# Patient Record
Sex: Female | Born: 2000 | Race: Asian | Hispanic: No | Marital: Single | Smoking: Never smoker
Health system: Southern US, Community
[De-identification: ages and names within clinical notes are randomized; demographics above are authoritative.]

## PROBLEM LIST (undated history)

## (undated) DIAGNOSIS — F329 Major depressive disorder, single episode, unspecified: Secondary | ICD-10-CM

## (undated) DIAGNOSIS — F419 Anxiety disorder, unspecified: Secondary | ICD-10-CM

## (undated) DIAGNOSIS — T7840XA Allergy, unspecified, initial encounter: Secondary | ICD-10-CM

## (undated) HISTORY — PX: HERNIA REPAIR: SHX51

## (undated) HISTORY — PX: TONSILLECTOMY: SUR1361

## (undated) HISTORY — DX: Major depressive disorder, single episode, unspecified: F32.9

---
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
## 2022-01-08 DIAGNOSIS — F330 Major depressive disorder, recurrent, mild: Secondary | ICD-10-CM | POA: Diagnosis not present

## 2022-01-21 DIAGNOSIS — F33 Major depressive disorder, recurrent: Secondary | ICD-10-CM | POA: Diagnosis not present

## 2022-01-22 DIAGNOSIS — F330 Major depressive disorder, recurrent, mild: Secondary | ICD-10-CM | POA: Diagnosis not present

## 2022-01-27 DIAGNOSIS — F33 Major depressive disorder, recurrent: Secondary | ICD-10-CM | POA: Diagnosis not present

## 2022-01-29 DIAGNOSIS — F330 Major depressive disorder, recurrent, mild: Secondary | ICD-10-CM | POA: Diagnosis not present

## 2022-02-05 DIAGNOSIS — F330 Major depressive disorder, recurrent, mild: Secondary | ICD-10-CM | POA: Diagnosis not present

## 2022-02-11 ENCOUNTER — Encounter: Payer: Self-pay | Admitting: Gastroenterology

## 2022-02-12 DIAGNOSIS — F330 Major depressive disorder, recurrent, mild: Secondary | ICD-10-CM | POA: Diagnosis not present

## 2022-02-19 DIAGNOSIS — F330 Major depressive disorder, recurrent, mild: Secondary | ICD-10-CM | POA: Diagnosis not present

## 2022-02-26 DIAGNOSIS — F330 Major depressive disorder, recurrent, mild: Secondary | ICD-10-CM | POA: Diagnosis not present

## 2022-03-05 DIAGNOSIS — F330 Major depressive disorder, recurrent, mild: Secondary | ICD-10-CM | POA: Diagnosis not present

## 2022-03-12 DIAGNOSIS — F330 Major depressive disorder, recurrent, mild: Secondary | ICD-10-CM | POA: Diagnosis not present

## 2022-03-19 DIAGNOSIS — F330 Major depressive disorder, recurrent, mild: Secondary | ICD-10-CM | POA: Diagnosis not present

## 2022-03-26 DIAGNOSIS — F330 Major depressive disorder, recurrent, mild: Secondary | ICD-10-CM | POA: Diagnosis not present

## 2022-03-29 DIAGNOSIS — R509 Fever, unspecified: Secondary | ICD-10-CM | POA: Diagnosis not present

## 2022-03-29 DIAGNOSIS — J029 Acute pharyngitis, unspecified: Secondary | ICD-10-CM | POA: Diagnosis not present

## 2022-04-06 DIAGNOSIS — F330 Major depressive disorder, recurrent, mild: Secondary | ICD-10-CM | POA: Diagnosis not present

## 2022-04-07 DIAGNOSIS — J069 Acute upper respiratory infection, unspecified: Secondary | ICD-10-CM | POA: Diagnosis not present

## 2022-04-16 DIAGNOSIS — F330 Major depressive disorder, recurrent, mild: Secondary | ICD-10-CM | POA: Diagnosis not present

## 2022-04-28 DIAGNOSIS — F330 Major depressive disorder, recurrent, mild: Secondary | ICD-10-CM | POA: Diagnosis not present

## 2022-05-06 DIAGNOSIS — F330 Major depressive disorder, recurrent, mild: Secondary | ICD-10-CM | POA: Diagnosis not present

## 2022-05-21 DIAGNOSIS — F330 Major depressive disorder, recurrent, mild: Secondary | ICD-10-CM | POA: Diagnosis not present

## 2022-05-28 DIAGNOSIS — F330 Major depressive disorder, recurrent, mild: Secondary | ICD-10-CM | POA: Diagnosis not present

## 2022-06-04 DIAGNOSIS — F330 Major depressive disorder, recurrent, mild: Secondary | ICD-10-CM | POA: Diagnosis not present

## 2022-06-11 DIAGNOSIS — F330 Major depressive disorder, recurrent, mild: Secondary | ICD-10-CM | POA: Diagnosis not present

## 2022-06-18 DIAGNOSIS — F330 Major depressive disorder, recurrent, mild: Secondary | ICD-10-CM | POA: Diagnosis not present

## 2022-06-25 DIAGNOSIS — F330 Major depressive disorder, recurrent, mild: Secondary | ICD-10-CM | POA: Diagnosis not present

## 2022-07-02 DIAGNOSIS — F330 Major depressive disorder, recurrent, mild: Secondary | ICD-10-CM | POA: Diagnosis not present

## 2022-07-09 DIAGNOSIS — F330 Major depressive disorder, recurrent, mild: Secondary | ICD-10-CM | POA: Diagnosis not present

## 2022-07-15 DIAGNOSIS — F330 Major depressive disorder, recurrent, mild: Secondary | ICD-10-CM | POA: Diagnosis not present

## 2022-08-06 DIAGNOSIS — F330 Major depressive disorder, recurrent, mild: Secondary | ICD-10-CM | POA: Diagnosis not present

## 2022-08-13 DIAGNOSIS — F330 Major depressive disorder, recurrent, mild: Secondary | ICD-10-CM | POA: Diagnosis not present

## 2022-08-20 DIAGNOSIS — F330 Major depressive disorder, recurrent, mild: Secondary | ICD-10-CM | POA: Diagnosis not present

## 2022-08-27 DIAGNOSIS — F330 Major depressive disorder, recurrent, mild: Secondary | ICD-10-CM | POA: Diagnosis not present

## 2022-09-10 DIAGNOSIS — F330 Major depressive disorder, recurrent, mild: Secondary | ICD-10-CM | POA: Diagnosis not present

## 2022-09-17 DIAGNOSIS — F330 Major depressive disorder, recurrent, mild: Secondary | ICD-10-CM | POA: Diagnosis not present

## 2022-09-22 DIAGNOSIS — R29898 Other symptoms and signs involving the musculoskeletal system: Secondary | ICD-10-CM | POA: Diagnosis not present

## 2022-09-24 DIAGNOSIS — F330 Major depressive disorder, recurrent, mild: Secondary | ICD-10-CM | POA: Diagnosis not present

## 2022-10-01 DIAGNOSIS — F330 Major depressive disorder, recurrent, mild: Secondary | ICD-10-CM | POA: Diagnosis not present

## 2022-10-08 DIAGNOSIS — F330 Major depressive disorder, recurrent, mild: Secondary | ICD-10-CM | POA: Diagnosis not present

## 2022-10-15 DIAGNOSIS — F330 Major depressive disorder, recurrent, mild: Secondary | ICD-10-CM | POA: Diagnosis not present

## 2022-10-22 DIAGNOSIS — F330 Major depressive disorder, recurrent, mild: Secondary | ICD-10-CM | POA: Diagnosis not present

## 2022-10-29 DIAGNOSIS — F330 Major depressive disorder, recurrent, mild: Secondary | ICD-10-CM | POA: Diagnosis not present

## 2022-11-09 DIAGNOSIS — F330 Major depressive disorder, recurrent, mild: Secondary | ICD-10-CM | POA: Diagnosis not present

## 2022-11-16 DIAGNOSIS — F330 Major depressive disorder, recurrent, mild: Secondary | ICD-10-CM | POA: Diagnosis not present

## 2022-11-23 DIAGNOSIS — F330 Major depressive disorder, recurrent, mild: Secondary | ICD-10-CM | POA: Diagnosis not present

## 2022-11-30 DIAGNOSIS — F330 Major depressive disorder, recurrent, mild: Secondary | ICD-10-CM | POA: Diagnosis not present

## 2022-12-07 DIAGNOSIS — F330 Major depressive disorder, recurrent, mild: Secondary | ICD-10-CM | POA: Diagnosis not present

## 2022-12-14 DIAGNOSIS — Z0000 Encounter for general adult medical examination without abnormal findings: Secondary | ICD-10-CM | POA: Diagnosis not present

## 2022-12-14 DIAGNOSIS — F332 Major depressive disorder, recurrent severe without psychotic features: Secondary | ICD-10-CM | POA: Diagnosis not present

## 2022-12-14 DIAGNOSIS — D500 Iron deficiency anemia secondary to blood loss (chronic): Secondary | ICD-10-CM | POA: Diagnosis not present

## 2022-12-14 DIAGNOSIS — E559 Vitamin D deficiency, unspecified: Secondary | ICD-10-CM | POA: Diagnosis not present

## 2022-12-14 DIAGNOSIS — F330 Major depressive disorder, recurrent, mild: Secondary | ICD-10-CM | POA: Diagnosis not present

## 2022-12-31 DIAGNOSIS — F330 Major depressive disorder, recurrent, mild: Secondary | ICD-10-CM | POA: Diagnosis not present

## 2022-12-31 DIAGNOSIS — F331 Major depressive disorder, recurrent, moderate: Secondary | ICD-10-CM | POA: Diagnosis not present

## 2023-01-07 DIAGNOSIS — F330 Major depressive disorder, recurrent, mild: Secondary | ICD-10-CM | POA: Diagnosis not present

## 2023-01-07 DIAGNOSIS — F331 Major depressive disorder, recurrent, moderate: Secondary | ICD-10-CM | POA: Diagnosis not present

## 2023-01-14 DIAGNOSIS — F331 Major depressive disorder, recurrent, moderate: Secondary | ICD-10-CM | POA: Diagnosis not present

## 2023-01-21 DIAGNOSIS — F331 Major depressive disorder, recurrent, moderate: Secondary | ICD-10-CM | POA: Diagnosis not present

## 2023-02-04 DIAGNOSIS — F331 Major depressive disorder, recurrent, moderate: Secondary | ICD-10-CM | POA: Diagnosis not present

## 2023-02-11 DIAGNOSIS — F331 Major depressive disorder, recurrent, moderate: Secondary | ICD-10-CM | POA: Diagnosis not present

## 2023-02-18 DIAGNOSIS — F331 Major depressive disorder, recurrent, moderate: Secondary | ICD-10-CM | POA: Diagnosis not present

## 2023-02-25 DIAGNOSIS — F331 Major depressive disorder, recurrent, moderate: Secondary | ICD-10-CM | POA: Diagnosis not present

## 2023-02-28 DIAGNOSIS — D500 Iron deficiency anemia secondary to blood loss (chronic): Secondary | ICD-10-CM | POA: Diagnosis not present

## 2023-02-28 DIAGNOSIS — Z79899 Other long term (current) drug therapy: Secondary | ICD-10-CM | POA: Diagnosis not present

## 2023-03-11 DIAGNOSIS — F331 Major depressive disorder, recurrent, moderate: Secondary | ICD-10-CM | POA: Diagnosis not present

## 2023-03-18 DIAGNOSIS — F331 Major depressive disorder, recurrent, moderate: Secondary | ICD-10-CM | POA: Diagnosis not present

## 2023-03-25 DIAGNOSIS — F331 Major depressive disorder, recurrent, moderate: Secondary | ICD-10-CM | POA: Diagnosis not present

## 2023-04-08 DIAGNOSIS — F331 Major depressive disorder, recurrent, moderate: Secondary | ICD-10-CM | POA: Diagnosis not present

## 2023-04-22 DIAGNOSIS — F331 Major depressive disorder, recurrent, moderate: Secondary | ICD-10-CM | POA: Diagnosis not present

## 2023-04-29 DIAGNOSIS — F331 Major depressive disorder, recurrent, moderate: Secondary | ICD-10-CM | POA: Diagnosis not present

## 2023-05-27 DIAGNOSIS — F331 Major depressive disorder, recurrent, moderate: Secondary | ICD-10-CM | POA: Diagnosis not present

## 2023-06-03 DIAGNOSIS — F331 Major depressive disorder, recurrent, moderate: Secondary | ICD-10-CM | POA: Diagnosis not present

## 2023-06-10 DIAGNOSIS — F331 Major depressive disorder, recurrent, moderate: Secondary | ICD-10-CM | POA: Diagnosis not present

## 2023-06-10 DIAGNOSIS — E559 Vitamin D deficiency, unspecified: Secondary | ICD-10-CM | POA: Diagnosis not present

## 2023-06-10 DIAGNOSIS — D500 Iron deficiency anemia secondary to blood loss (chronic): Secondary | ICD-10-CM | POA: Diagnosis not present

## 2023-06-17 DIAGNOSIS — F331 Major depressive disorder, recurrent, moderate: Secondary | ICD-10-CM | POA: Diagnosis not present

## 2023-07-01 DIAGNOSIS — F331 Major depressive disorder, recurrent, moderate: Secondary | ICD-10-CM | POA: Diagnosis not present

## 2023-07-08 DIAGNOSIS — F331 Major depressive disorder, recurrent, moderate: Secondary | ICD-10-CM | POA: Diagnosis not present

## 2023-07-14 DIAGNOSIS — F331 Major depressive disorder, recurrent, moderate: Secondary | ICD-10-CM | POA: Diagnosis not present

## 2023-07-22 DIAGNOSIS — F331 Major depressive disorder, recurrent, moderate: Secondary | ICD-10-CM | POA: Diagnosis not present

## 2023-08-05 DIAGNOSIS — F331 Major depressive disorder, recurrent, moderate: Secondary | ICD-10-CM | POA: Diagnosis not present

## 2023-08-12 DIAGNOSIS — F331 Major depressive disorder, recurrent, moderate: Secondary | ICD-10-CM | POA: Diagnosis not present

## 2023-08-19 DIAGNOSIS — F331 Major depressive disorder, recurrent, moderate: Secondary | ICD-10-CM | POA: Diagnosis not present

## 2023-08-26 DIAGNOSIS — F331 Major depressive disorder, recurrent, moderate: Secondary | ICD-10-CM | POA: Diagnosis not present

## 2023-09-02 DIAGNOSIS — F331 Major depressive disorder, recurrent, moderate: Secondary | ICD-10-CM | POA: Diagnosis not present

## 2023-09-16 DIAGNOSIS — F331 Major depressive disorder, recurrent, moderate: Secondary | ICD-10-CM | POA: Diagnosis not present

## 2023-09-30 DIAGNOSIS — F411 Generalized anxiety disorder: Secondary | ICD-10-CM | POA: Diagnosis not present

## 2023-09-30 DIAGNOSIS — F900 Attention-deficit hyperactivity disorder, predominantly inattentive type: Secondary | ICD-10-CM | POA: Diagnosis not present

## 2023-09-30 DIAGNOSIS — F331 Major depressive disorder, recurrent, moderate: Secondary | ICD-10-CM | POA: Diagnosis not present

## 2023-10-03 DIAGNOSIS — M67911 Unspecified disorder of synovium and tendon, right shoulder: Secondary | ICD-10-CM | POA: Diagnosis not present

## 2023-10-04 DIAGNOSIS — S46011D Strain of muscle(s) and tendon(s) of the rotator cuff of right shoulder, subsequent encounter: Secondary | ICD-10-CM | POA: Diagnosis not present

## 2023-10-05 DIAGNOSIS — S46011D Strain of muscle(s) and tendon(s) of the rotator cuff of right shoulder, subsequent encounter: Secondary | ICD-10-CM | POA: Diagnosis not present

## 2023-10-07 DIAGNOSIS — F900 Attention-deficit hyperactivity disorder, predominantly inattentive type: Secondary | ICD-10-CM | POA: Diagnosis not present

## 2023-10-07 DIAGNOSIS — F331 Major depressive disorder, recurrent, moderate: Secondary | ICD-10-CM | POA: Diagnosis not present

## 2023-10-07 DIAGNOSIS — F411 Generalized anxiety disorder: Secondary | ICD-10-CM | POA: Diagnosis not present

## 2023-10-21 DIAGNOSIS — F411 Generalized anxiety disorder: Secondary | ICD-10-CM | POA: Diagnosis not present

## 2023-10-21 DIAGNOSIS — F900 Attention-deficit hyperactivity disorder, predominantly inattentive type: Secondary | ICD-10-CM | POA: Diagnosis not present

## 2023-10-21 DIAGNOSIS — F331 Major depressive disorder, recurrent, moderate: Secondary | ICD-10-CM | POA: Diagnosis not present

## 2023-10-24 DIAGNOSIS — S46011D Strain of muscle(s) and tendon(s) of the rotator cuff of right shoulder, subsequent encounter: Secondary | ICD-10-CM | POA: Diagnosis not present

## 2023-10-26 DIAGNOSIS — S46011D Strain of muscle(s) and tendon(s) of the rotator cuff of right shoulder, subsequent encounter: Secondary | ICD-10-CM | POA: Diagnosis not present

## 2023-10-28 DIAGNOSIS — F900 Attention-deficit hyperactivity disorder, predominantly inattentive type: Secondary | ICD-10-CM | POA: Diagnosis not present

## 2023-10-28 DIAGNOSIS — F331 Major depressive disorder, recurrent, moderate: Secondary | ICD-10-CM | POA: Diagnosis not present

## 2023-10-28 DIAGNOSIS — F411 Generalized anxiety disorder: Secondary | ICD-10-CM | POA: Diagnosis not present

## 2023-10-31 DIAGNOSIS — S46011D Strain of muscle(s) and tendon(s) of the rotator cuff of right shoulder, subsequent encounter: Secondary | ICD-10-CM | POA: Diagnosis not present

## 2023-11-04 DIAGNOSIS — F331 Major depressive disorder, recurrent, moderate: Secondary | ICD-10-CM | POA: Diagnosis not present

## 2023-11-04 DIAGNOSIS — F411 Generalized anxiety disorder: Secondary | ICD-10-CM | POA: Diagnosis not present

## 2023-11-04 DIAGNOSIS — F900 Attention-deficit hyperactivity disorder, predominantly inattentive type: Secondary | ICD-10-CM | POA: Diagnosis not present

## 2023-11-14 DIAGNOSIS — S46011D Strain of muscle(s) and tendon(s) of the rotator cuff of right shoulder, subsequent encounter: Secondary | ICD-10-CM | POA: Diagnosis not present

## 2023-11-25 DIAGNOSIS — F411 Generalized anxiety disorder: Secondary | ICD-10-CM | POA: Diagnosis not present

## 2023-11-25 DIAGNOSIS — F331 Major depressive disorder, recurrent, moderate: Secondary | ICD-10-CM | POA: Diagnosis not present

## 2023-11-25 DIAGNOSIS — F900 Attention-deficit hyperactivity disorder, predominantly inattentive type: Secondary | ICD-10-CM | POA: Diagnosis not present

## 2023-12-09 DIAGNOSIS — F900 Attention-deficit hyperactivity disorder, predominantly inattentive type: Secondary | ICD-10-CM | POA: Diagnosis not present

## 2023-12-09 DIAGNOSIS — F411 Generalized anxiety disorder: Secondary | ICD-10-CM | POA: Diagnosis not present

## 2023-12-09 DIAGNOSIS — F331 Major depressive disorder, recurrent, moderate: Secondary | ICD-10-CM | POA: Diagnosis not present

## 2023-12-14 DIAGNOSIS — M25551 Pain in right hip: Secondary | ICD-10-CM | POA: Diagnosis not present

## 2023-12-16 DIAGNOSIS — M25511 Pain in right shoulder: Secondary | ICD-10-CM | POA: Diagnosis not present

## 2023-12-20 DIAGNOSIS — R059 Cough, unspecified: Secondary | ICD-10-CM | POA: Diagnosis not present

## 2023-12-28 DIAGNOSIS — D500 Iron deficiency anemia secondary to blood loss (chronic): Secondary | ICD-10-CM | POA: Diagnosis not present

## 2023-12-28 DIAGNOSIS — F411 Generalized anxiety disorder: Secondary | ICD-10-CM | POA: Diagnosis not present

## 2023-12-28 DIAGNOSIS — M25511 Pain in right shoulder: Secondary | ICD-10-CM | POA: Diagnosis not present

## 2023-12-28 DIAGNOSIS — E559 Vitamin D deficiency, unspecified: Secondary | ICD-10-CM | POA: Diagnosis not present

## 2023-12-28 DIAGNOSIS — Z0000 Encounter for general adult medical examination without abnormal findings: Secondary | ICD-10-CM | POA: Diagnosis not present

## 2023-12-28 DIAGNOSIS — G43909 Migraine, unspecified, not intractable, without status migrainosus: Secondary | ICD-10-CM | POA: Diagnosis not present

## 2023-12-28 DIAGNOSIS — F331 Major depressive disorder, recurrent, moderate: Secondary | ICD-10-CM | POA: Diagnosis not present

## 2023-12-28 DIAGNOSIS — E611 Iron deficiency: Secondary | ICD-10-CM | POA: Diagnosis not present

## 2023-12-28 DIAGNOSIS — F900 Attention-deficit hyperactivity disorder, predominantly inattentive type: Secondary | ICD-10-CM | POA: Diagnosis not present

## 2024-01-06 DIAGNOSIS — F900 Attention-deficit hyperactivity disorder, predominantly inattentive type: Secondary | ICD-10-CM | POA: Diagnosis not present

## 2024-01-06 DIAGNOSIS — F331 Major depressive disorder, recurrent, moderate: Secondary | ICD-10-CM | POA: Diagnosis not present

## 2024-01-06 DIAGNOSIS — F411 Generalized anxiety disorder: Secondary | ICD-10-CM | POA: Diagnosis not present

## 2024-01-20 DIAGNOSIS — F331 Major depressive disorder, recurrent, moderate: Secondary | ICD-10-CM | POA: Diagnosis not present

## 2024-01-20 DIAGNOSIS — F411 Generalized anxiety disorder: Secondary | ICD-10-CM | POA: Diagnosis not present

## 2024-01-20 DIAGNOSIS — F900 Attention-deficit hyperactivity disorder, predominantly inattentive type: Secondary | ICD-10-CM | POA: Diagnosis not present

## 2024-01-31 DIAGNOSIS — F331 Major depressive disorder, recurrent, moderate: Secondary | ICD-10-CM | POA: Diagnosis not present

## 2024-01-31 DIAGNOSIS — F411 Generalized anxiety disorder: Secondary | ICD-10-CM | POA: Diagnosis not present

## 2024-01-31 DIAGNOSIS — F900 Attention-deficit hyperactivity disorder, predominantly inattentive type: Secondary | ICD-10-CM | POA: Diagnosis not present

## 2024-02-08 DIAGNOSIS — G43719 Chronic migraine without aura, intractable, without status migrainosus: Secondary | ICD-10-CM | POA: Diagnosis not present

## 2024-02-17 DIAGNOSIS — F411 Generalized anxiety disorder: Secondary | ICD-10-CM | POA: Diagnosis not present

## 2024-02-17 DIAGNOSIS — F900 Attention-deficit hyperactivity disorder, predominantly inattentive type: Secondary | ICD-10-CM | POA: Diagnosis not present

## 2024-02-17 DIAGNOSIS — F331 Major depressive disorder, recurrent, moderate: Secondary | ICD-10-CM | POA: Diagnosis not present

## 2024-02-24 DIAGNOSIS — F900 Attention-deficit hyperactivity disorder, predominantly inattentive type: Secondary | ICD-10-CM | POA: Diagnosis not present

## 2024-02-24 DIAGNOSIS — F411 Generalized anxiety disorder: Secondary | ICD-10-CM | POA: Diagnosis not present

## 2024-03-02 DIAGNOSIS — F900 Attention-deficit hyperactivity disorder, predominantly inattentive type: Secondary | ICD-10-CM | POA: Diagnosis not present

## 2024-03-02 DIAGNOSIS — F331 Major depressive disorder, recurrent, moderate: Secondary | ICD-10-CM | POA: Diagnosis not present

## 2024-03-02 DIAGNOSIS — F411 Generalized anxiety disorder: Secondary | ICD-10-CM | POA: Diagnosis not present

## 2024-03-16 DIAGNOSIS — F411 Generalized anxiety disorder: Secondary | ICD-10-CM | POA: Diagnosis not present

## 2024-03-16 DIAGNOSIS — F900 Attention-deficit hyperactivity disorder, predominantly inattentive type: Secondary | ICD-10-CM | POA: Diagnosis not present

## 2024-03-30 DIAGNOSIS — F900 Attention-deficit hyperactivity disorder, predominantly inattentive type: Secondary | ICD-10-CM | POA: Diagnosis not present

## 2024-03-30 DIAGNOSIS — F411 Generalized anxiety disorder: Secondary | ICD-10-CM | POA: Diagnosis not present

## 2024-04-05 DIAGNOSIS — F900 Attention-deficit hyperactivity disorder, predominantly inattentive type: Secondary | ICD-10-CM | POA: Diagnosis not present

## 2024-04-05 DIAGNOSIS — F411 Generalized anxiety disorder: Secondary | ICD-10-CM | POA: Diagnosis not present

## 2024-04-19 DIAGNOSIS — F411 Generalized anxiety disorder: Secondary | ICD-10-CM | POA: Diagnosis not present

## 2024-04-19 DIAGNOSIS — F331 Major depressive disorder, recurrent, moderate: Secondary | ICD-10-CM | POA: Diagnosis not present

## 2024-04-19 DIAGNOSIS — F900 Attention-deficit hyperactivity disorder, predominantly inattentive type: Secondary | ICD-10-CM | POA: Diagnosis not present

## 2024-04-25 DIAGNOSIS — F900 Attention-deficit hyperactivity disorder, predominantly inattentive type: Secondary | ICD-10-CM | POA: Diagnosis not present

## 2024-04-25 DIAGNOSIS — F411 Generalized anxiety disorder: Secondary | ICD-10-CM | POA: Diagnosis not present

## 2024-04-25 DIAGNOSIS — F331 Major depressive disorder, recurrent, moderate: Secondary | ICD-10-CM | POA: Diagnosis not present
